# Patient Record
Sex: Female | Born: 1965 | Race: White | Hispanic: No | Smoking: Never smoker
Health system: Southern US, Community
[De-identification: ages and names within clinical notes are randomized; demographics above are authoritative.]

## PROBLEM LIST (undated history)

## (undated) DIAGNOSIS — F32A Depression, unspecified: Secondary | ICD-10-CM

## (undated) DIAGNOSIS — T7840XA Allergy, unspecified, initial encounter: Secondary | ICD-10-CM

## (undated) DIAGNOSIS — F329 Major depressive disorder, single episode, unspecified: Secondary | ICD-10-CM

## (undated) DIAGNOSIS — E079 Disorder of thyroid, unspecified: Secondary | ICD-10-CM

## (undated) DIAGNOSIS — L300 Nummular dermatitis: Secondary | ICD-10-CM

## (undated) HISTORY — DX: Depression, unspecified: F32.A

## (undated) HISTORY — DX: Allergy, unspecified, initial encounter: T78.40XA

## (undated) HISTORY — DX: Nummular dermatitis: L30.0

## (undated) HISTORY — PX: BREAST SURGERY: SHX581

## (undated) HISTORY — DX: Disorder of thyroid, unspecified: E07.9

## (undated) HISTORY — PX: KNEE ARTHROSCOPY: SUR90

## (undated) HISTORY — DX: Major depressive disorder, single episode, unspecified: F32.9

---

## 1998-08-08 ENCOUNTER — Other Ambulatory Visit: Admission: RE | Admit: 1998-08-08 | Discharge: 1998-08-08 | Payer: Self-pay | Admitting: Obstetrics and Gynecology

## 1999-07-14 ENCOUNTER — Other Ambulatory Visit: Admission: RE | Admit: 1999-07-14 | Discharge: 1999-07-14 | Payer: Self-pay | Admitting: Obstetrics and Gynecology

## 2000-07-18 ENCOUNTER — Other Ambulatory Visit: Admission: RE | Admit: 2000-07-18 | Discharge: 2000-07-18 | Payer: Self-pay | Admitting: Obstetrics and Gynecology

## 2000-08-22 ENCOUNTER — Other Ambulatory Visit: Admission: RE | Admit: 2000-08-22 | Discharge: 2000-08-22 | Payer: Self-pay | Admitting: Obstetrics and Gynecology

## 2001-06-21 ENCOUNTER — Other Ambulatory Visit: Admission: RE | Admit: 2001-06-21 | Discharge: 2001-06-21 | Payer: Self-pay | Admitting: Obstetrics and Gynecology

## 2001-06-27 ENCOUNTER — Encounter: Admission: RE | Admit: 2001-06-27 | Discharge: 2001-06-27 | Payer: Self-pay | Admitting: Obstetrics and Gynecology

## 2001-07-13 ENCOUNTER — Ambulatory Visit (HOSPITAL_BASED_OUTPATIENT_CLINIC_OR_DEPARTMENT_OTHER): Admission: RE | Admit: 2001-07-13 | Discharge: 2001-07-13 | Payer: Self-pay | Admitting: General Surgery

## 2001-07-13 ENCOUNTER — Encounter: Payer: Self-pay | Admitting: General Surgery

## 2001-07-13 ENCOUNTER — Encounter: Admission: RE | Admit: 2001-07-13 | Discharge: 2001-07-13 | Payer: Self-pay | Admitting: General Surgery

## 2001-07-13 ENCOUNTER — Encounter (INDEPENDENT_AMBULATORY_CARE_PROVIDER_SITE_OTHER): Payer: Self-pay | Admitting: *Deleted

## 2002-07-09 ENCOUNTER — Other Ambulatory Visit: Admission: RE | Admit: 2002-07-09 | Discharge: 2002-07-09 | Payer: Self-pay | Admitting: Obstetrics and Gynecology

## 2002-11-21 ENCOUNTER — Other Ambulatory Visit: Admission: RE | Admit: 2002-11-21 | Discharge: 2002-11-21 | Payer: Self-pay | Admitting: Obstetrics and Gynecology

## 2003-09-24 ENCOUNTER — Other Ambulatory Visit: Admission: RE | Admit: 2003-09-24 | Discharge: 2003-09-24 | Payer: Self-pay | Admitting: Obstetrics and Gynecology

## 2004-08-19 ENCOUNTER — Other Ambulatory Visit: Admission: RE | Admit: 2004-08-19 | Discharge: 2004-08-19 | Payer: Self-pay | Admitting: Obstetrics and Gynecology

## 2005-09-13 ENCOUNTER — Other Ambulatory Visit: Admission: RE | Admit: 2005-09-13 | Discharge: 2005-09-13 | Payer: Self-pay | Admitting: Obstetrics and Gynecology

## 2007-08-15 ENCOUNTER — Encounter: Admission: RE | Admit: 2007-08-15 | Discharge: 2007-08-15 | Payer: Self-pay | Admitting: Obstetrics and Gynecology

## 2010-01-17 IMAGING — MG MM DIAGNOSTIC LTD RIGHT
3 series · 3 of 3 positions shown · non-contrast
Comparison: 08-02-07 and 09-13-05.

[REDACTED] RIGHT
CC and MLO view(s) were taken of the right breast.

DIGITAL LIMITED RIGHT DIAGNOSTIC MAMMOGRAM:
CLINICAL DATA: 42-year-old with asymmetric density right breast on recent screening mammogram.

[R MLO (1 of 2)]
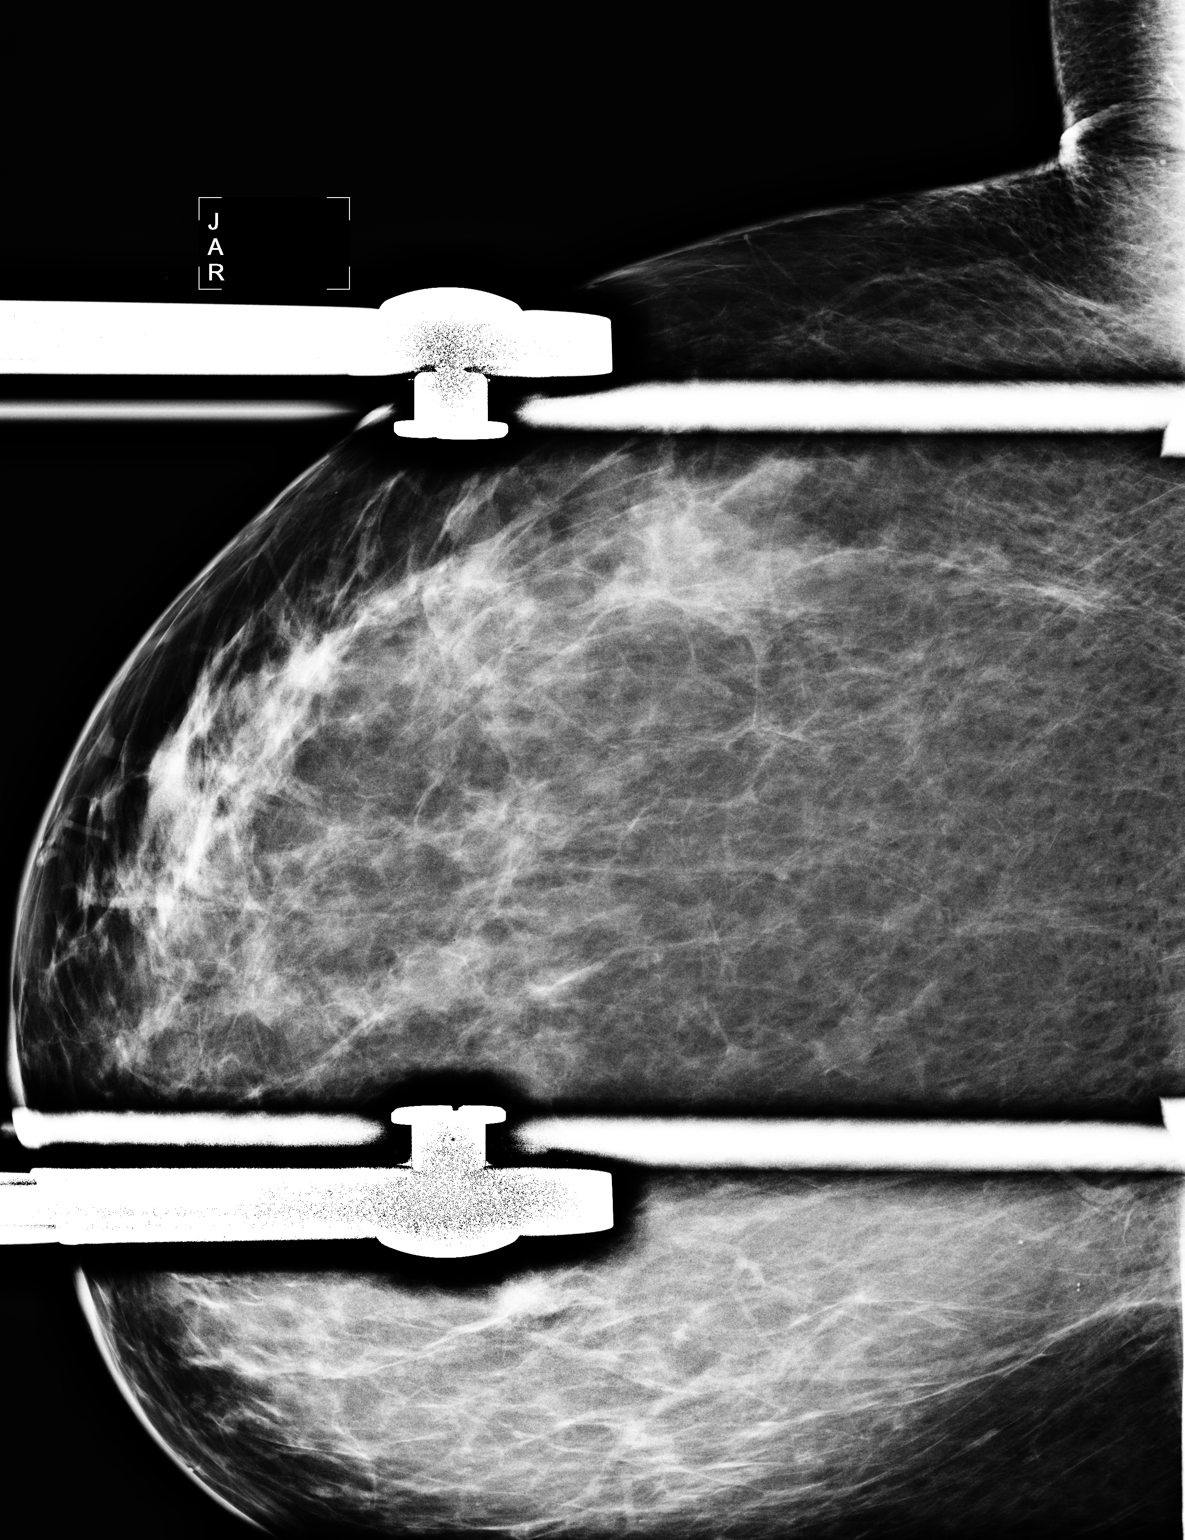

[R CC]
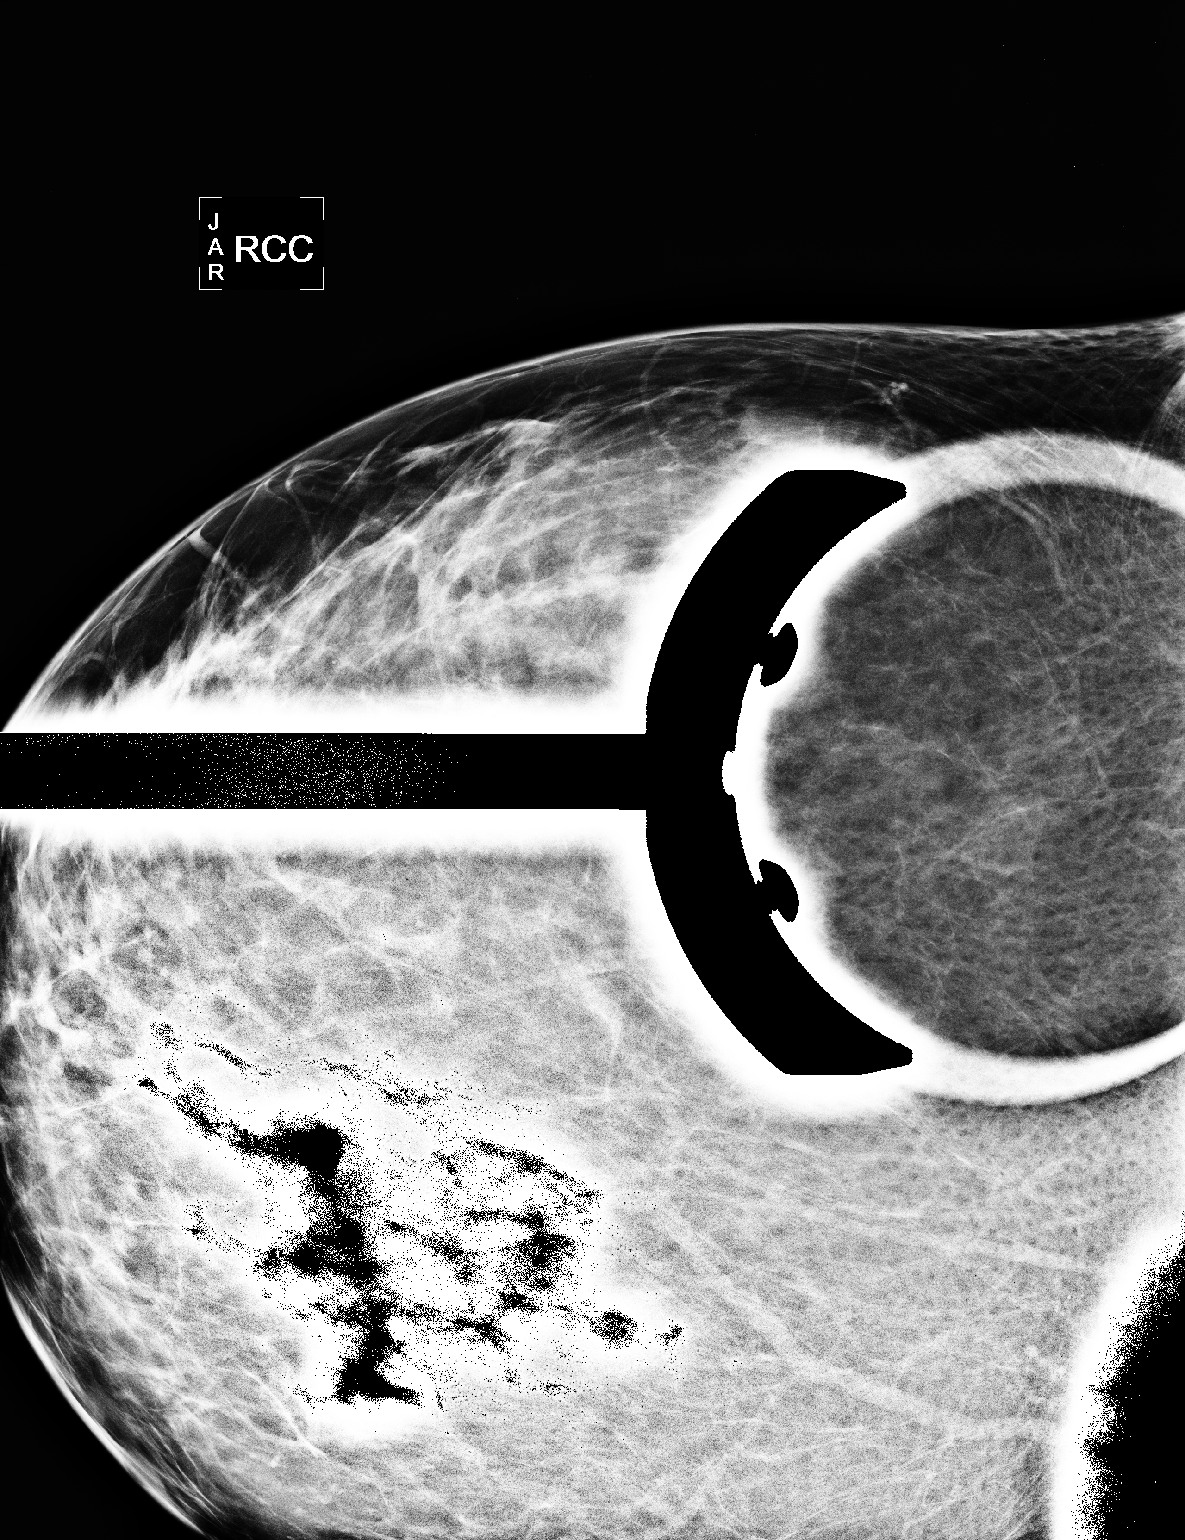

[R MLO (2 of 2)]
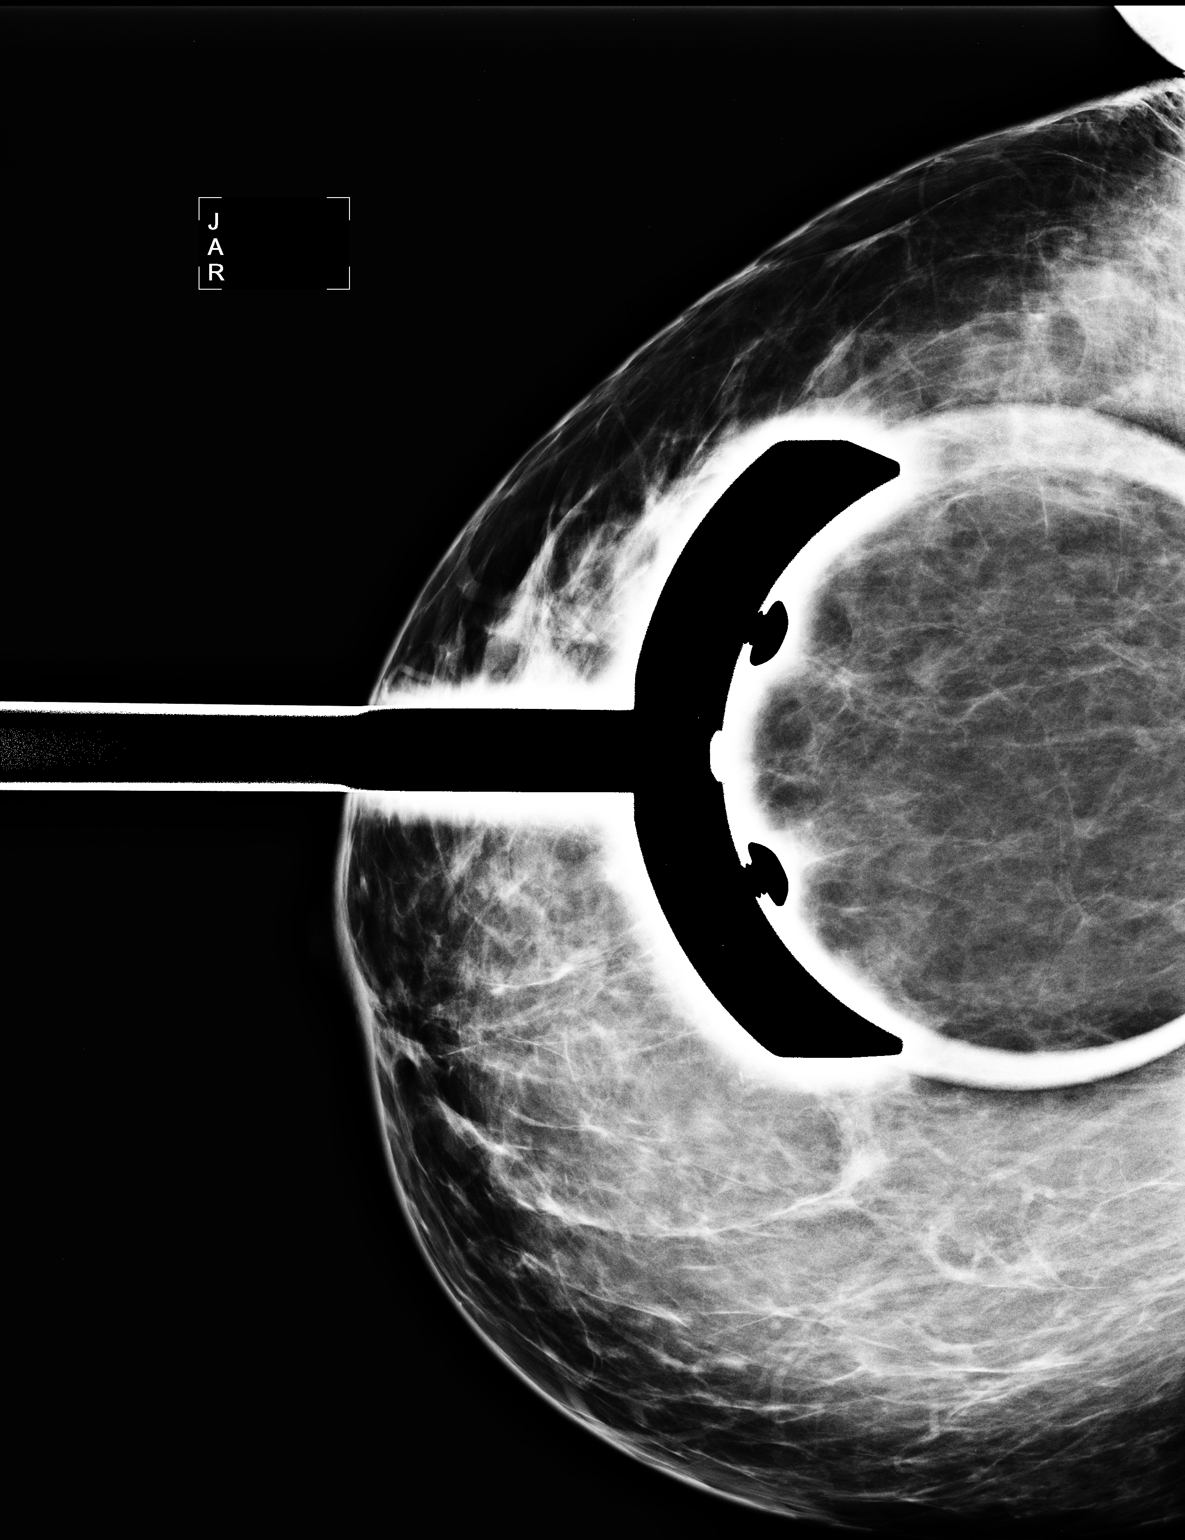

[3 of 3 positions shown; findings below may reference images not displayed]

A spot compression view of the posterior aspect of the outer right breast shows dispersion of 
asymmetric density seen on the screening mammogram.  On the spot compression view, only scattered 
fibroglandular densities are identified, without evidence of mass.  A spot compression view of the 
upper right breast shows heterogeneously dense breast parenchyma, similar in appearance to the 
5330mammogram, and without evidence of mass.
IMPRESSION: No evidence of malignancy in the right breast.  The area in questioned on screening mammogram is 
consistent with overlap of normal fibroglandular breast tissue.  Recommend screening mammogram in 
one year.

ASSESSMENT: Negative - BI-RADS 1

Screening mammogram of both breasts in 11 months.
, THIS PROCEDURE WAS A DIGITAL MAMMOGRAM.

## 2010-06-28 ENCOUNTER — Encounter: Payer: Self-pay | Admitting: Obstetrics and Gynecology

## 2010-10-23 NOTE — Op Note (Signed)
[place]  Patient:    SONI, KEGEL Visit Number: [id number] MRN: [id number]          Service Type: DSU Location: [facility] Attending Physician:  Arlis Porta Dictated by:   Adolph Pollack, M.D. Proc. Date: 07/13/01 Admit Date:  07/13/2001   CC:         Guy Sandifer. Ar[place], M.D.  [facility], [place]   Operative Report  PREOPERATIVE DIAGNOSIS:  Nonpalpable lesion of right breast.  POSTOPERATIVE DIAGNOSIS:  Nonpalpable lesion of right breast.  OPERATION PERFORMED:  Excision of right breast lesion after needle localization.  SURGEON:  Adolph Pollack, M.D.  ASSISTANT:  Cleotis Lema, M.D.  ANESTHESIA:  General.  INDICATIONS FOR PROCEDURE:  The patient is a 45 year old female who has been noted to have a nonpalpable lesion in the right breast.  It has been stable but she does have a risk factor for breast cancer and she requested that it be removed.  I do agree with her request and she was set up for the above procedure.  She underwent a successful needle localization at the breast center and then was brought over to the outpatient surgery center.  DESCRIPTION OF PROCEDURE:  The patient was brought to the operating room and placed supine on the operating table and given a general anesthetic.  The bandage over the wire was removed and the right breast was sterilely prepped and draped.  The mammograms were reviewed preoperatively.  The x on the breast tissue marked where the end of the wire was.  I made a curvilinear incision directly over the x and carried it down through the subcutaneous tissue.  I made flaps in all directions.  I then sharply excised a cylinder of tissue around the wire and sent this for specimen mammography. The specimen mammography demonstrated the lesion to be contained within the sample given.  Next, hemostasis was obtained using electrocautery.  I then closed the wound in two  layers, loosely approximating the subcutaneous tissue with interrupted 3-0 Vicryl sutures and closed the skin with 4-0 Monocryl subcuticular stitch. Steri-Strips and sterile dressings were applied.  The patient tolerated the procedure well without any apparent complications and was taken to the recovery room in satisfactory condition.  She was given postoperative instructions and was given Vicodin for postoperative pain. Dictated by:   Adolph Pollack, M.D. Attending Physician:  Arlis Porta DD:  07/13/01 TD:  07/14/01 Job: [id number]

## 2011-09-01 ENCOUNTER — Other Ambulatory Visit: Payer: Self-pay | Admitting: Obstetrics and Gynecology

## 2013-07-09 ENCOUNTER — Other Ambulatory Visit: Payer: Self-pay | Admitting: Obstetrics and Gynecology

## 2013-11-28 ENCOUNTER — Ambulatory Visit (INDEPENDENT_AMBULATORY_CARE_PROVIDER_SITE_OTHER): Payer: No Typology Code available for payment source | Admitting: Internal Medicine

## 2013-11-28 ENCOUNTER — Encounter: Payer: Self-pay | Admitting: Internal Medicine

## 2013-11-28 VITALS — BP 145/93 | HR 90 | Temp 98.2°F | Ht 70.0 in | Wt 257.0 lb

## 2013-11-28 DIAGNOSIS — Z22322 Carrier or suspected carrier of Methicillin resistant Staphylococcus aureus: Secondary | ICD-10-CM | POA: Insufficient documentation

## 2013-11-28 MED ORDER — CHLORHEXIDINE GLUCONATE 4 % EX LIQD: Freq: Every day | CUTANEOUS | Status: DC

## 2013-11-28 MED ORDER — MUPIROCIN 2 % EX OINT: 1.0000 "application " | TOPICAL_OINTMENT | Freq: Two times a day (BID) | CUTANEOUS | Status: DC

## 2013-11-28 NOTE — Patient Instructions (Signed)
[facility] Five day Treatment Plan for MRSA (Staph) Decolonization  Please let us know if you have questions or concerns or do not understand the information we give you.  Prepare Chose a period when you will be uninterrupted by going away or other distractions. To ensure that your skin is in good condition, follow the Routine Skin Care principles below to reduce drying and enhance healing. Do not start while you have any active boils. Routine Skin Care principles to reduce drying and enhance healing:  Avoid the use of soap when bathing or showering. DO NOT routinely use antiseptic solutions. If you need to use something, chose a soap substitute (examples -QV Wash or Cetaphil).   When drying with a towel, be gentle and pat dry your skin. Avoid rubbing the skin.   Reduce the overall frequency of bathing or showering. A short shower (3 minutes) is better than a bath in terms of its effect on the skin.   Use a simple sorbelene based-cream on your skin prior to showering and immediately after drying. (Examples: Hydroderm or other Sorbelene-based preparations). Especially protect healing or dry areas of skin in this way. Don't use a barrier cream with a vaseline base or with perfumes and additives. You are more likely to be allergic to these products, and cause further damage to your skin.   Make sure that you clean and cover any skin cuts or grazes that occur. Try to avoid picking or biting fingernails and the skin around the nails Keep your fingernails clipped short and clean to reduce problems caused by scratching.   For itchy skin, try gently massaging sorbelene-based cream into itchy areas instead of scratching it. A long-acting non-sedating antihistamine drug is the next option to try. However make sure that you are not taking medication that will interact with this drug type.                                                                         To prepare yourself  for your treatment, it is recommended that you complete the following steps:  Remove nose, ear and other body piercing items for several days prior to the treatment and keep them out during the treatment period   Purchase a new toothbrush, disposable razor (if used), sterident for dentures (if required) and a container of alcohol hand hygiene solution (gel or rub)   Discard old toothbrushes and razors when the treatment starts. Also discard opened deodorant rollers, skin adhesive tapes, skin creams and solutions- all of these may already be contaminated with staph   Discard pumice stone(s), sponges and disposable face cloths if used    Discard all make-up brushes, creams, and implements   Discard or hot wash all fluffy toys   Wash hair brush and comb, nail files, plastic toys and cutters in the dishwasher or purchase new ones   Remove nail varnish and artificial nails  Daily routine for 5 days     *Minimize contact with members of the community during the 5 days of treatment* Body washes The effectiveness of the program increases if the correct procedure is used:  Apply the provided antiseptic body wash (2% chlorhexidine) in the shower daily   Take   care to wash hair, under the arms and into the groin and into any folds of skin   Allow the antiseptic to remain on the skin for at least 5 minutes   Also try 1 capful of bleach in 1/2 bathtub of water for 15 minutes weekly x 3 weeks, blot dry  Nasal ointment  Disinfect your hands with alcohol gel/rub and allow to dry    Open the mupirocin 2% (Bactroban) nasal ointment.   Place small amount (size of match head) of ointment onto a clean cotton swab and massage gently around the inside of the nostril on one side, making sure not to insert it too deeply (no more than 2 cm or a little less than an inch).   Use a new cotton bud for the other nostril so that you do not contaminate the Bactroban tube with staph.    After applying the  ointment, press a finger against the nose next to the nostril opening and use a circular motion to spread the ointment within the nose   Apply the mupirocin ointment two times a day for 5 days   Disinfect your hands with alcohol rub/gel after applying the ointmentp>  Personal items (combs, razor, eyeglasses, jewelry, etc.)     Disinfect all personal items daily with an alcohol-based cleanser  House Environment and Clothes/Linens  On day 2 and 5 of the treatment, clean your house, (especially the bedroom and bathroom). Clean dust off all surfaces and then vacuum clean all floor surfaces AND soft furnishings (such as your favorite chair). If your chair/couch has a vinyl or leather covering then wipe over the chair with warm soapy water and then dry with a clean towel (which should then be washed). Staph lives in skin scales from humans that contaminate the environment. This can lead to re-infection.    Disinfect the shower floor and/or bath tub daily    On days 1, 3, AND 5 of the treatment wash your clothes, underwear, pajamas and bed linen (such as towels, sheets, washcloths, and bath mats). A hot wash with laundry detergent is best (there is no need to use expensive laundry detergent or powder). Dry clothes in sun if possible. Change into clean clothes or pajamas on those days after your shower.    Do not share or exchange any personal items of clothing  Sports/Gym     Surfaces, equipment and towels, and skin-to-skin contact are all potential sources for staph re-infection Pets Dogs and other companion animals can also be colonized with the same strains of MRSA. Best to wash or replace bedding material for the animal and wash the animal at least once during the treatment period with antiseptic solution (2% chlorhexidine wash). Ensure that the skin of the animal is kept in good condition. If the pet has any chronic skin disorders, consult your vet prior to starting your treatment process. What  about my partner, family, or household members? Usually when an aggressive strain of staph moves in to a family or household, only certain members of that group get infections (boils). This is despite the fact that the strain has probably transferred itself from person to person within the group. Those without boils may also be carrying the bacterium, however they must have better resistance (immunity) or perhaps have better skin condition. Staph likes to invade through cuts, scratches and skin with dermatitis or dryness.    Follow-up after decolonization treatment  Possible approaches will vary depending on how your treatment goes. Your Raechal Raben  will instruct you on the follow-up best suited for you. Some options are:   Wait and see - if no further boils occur within 6 months then it is probable that the strain of staph has been eliminated from you.    Continue intermittent body washes 1-2 times per week with 2% aqueous chlorhexidine soap preparation or similar   Future antibiotic use  The best preventative approach to avoiding future problems may be to avoid use of antibiotics unless there is a strong indication. Antibiotics are often prescribed for minor infections or for respiratory infections that are mostly due to viruses. It is in your best interest to ride these infections out rather than taking antibiotics. Taking antibiotics alters your natural bacterial flora on your skin and in your gut. This may reduce your resistance against acquiring a resistant bacterial strain such as MRSA).   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   References Wiese-Posselt et al. Clin Inf Dis 1610:96:[id number]  CDC:   [employer].sehttp://www.cdc.gov/ncidod/dhqp/ar_mrsa_[place].html

## 2013-11-28 NOTE — Assessment & Plan Note (Signed)
I gave instructions for repeating decolonization with more emphasis on 45 minutes spent with review of record with 50% spent in counseling of pt.

## 2013-11-28 NOTE — Progress Notes (Signed)
   Subjective:    Patient ID: Lori French, female    DOB: 06/18/1965, 48 y.o.   MRN: [id number]  HPI New patient evaluation.  Here due to recurrent episodes of cellulitis.  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  Has occurred about 6-7 times over the last year.  Most recently upper lip.  Now all healed.  Is frustrated with reccurence.  Hoping for a better solution.  Has tried bactroban in the nose, hibiclens, some environmental measures.  Had a fever last time.  Typically is lanced.     Review of Systems  Constitutional: Negative for fever and fatigue.  Gastrointestinal: Negative for diarrhea.  Hematological: Negative for adenopathy.       Objective:   Physical Exam  Constitutional: She appears well-developed and well-nourished.  Eyes: No scleral icterus.  Skin: No rash noted.          Assessment & Plan:

## 2014-03-12 ENCOUNTER — Other Ambulatory Visit: Payer: Self-pay | Admitting: Obstetrics and Gynecology

## 2014-03-13 LAB — CYTOLOGY - PAP

## 2014-07-05 ENCOUNTER — Other Ambulatory Visit: Payer: Self-pay | Admitting: Obstetrics and Gynecology

## 2014-07-08 LAB — CYTOLOGY - PAP

## 2015-04-24 ENCOUNTER — Ambulatory Visit: Payer: Self-pay | Admitting: Sports Medicine

## 2017-05-10 ENCOUNTER — Encounter: Payer: Self-pay | Admitting: Podiatry

## 2017-05-10 ENCOUNTER — Ambulatory Visit (INDEPENDENT_AMBULATORY_CARE_PROVIDER_SITE_OTHER): Payer: BLUE CROSS/BLUE SHIELD

## 2017-05-10 ENCOUNTER — Ambulatory Visit (INDEPENDENT_AMBULATORY_CARE_PROVIDER_SITE_OTHER): Payer: BLUE CROSS/BLUE SHIELD | Admitting: Podiatry

## 2017-05-10 VITALS — BP 102/50 | HR 79 | Resp 16

## 2017-05-10 DIAGNOSIS — M779 Enthesopathy, unspecified: Principal | ICD-10-CM

## 2017-05-10 DIAGNOSIS — M7750 Other enthesopathy of unspecified foot: Secondary | ICD-10-CM

## 2017-05-10 DIAGNOSIS — M778 Other enthesopathies, not elsewhere classified: Secondary | ICD-10-CM

## 2017-05-10 MED ORDER — METHYLPREDNISOLONE 4 MG PO TBPK: ORAL_TABLET | ORAL | 0 refills | Status: DC

## 2017-05-10 MED ORDER — MELOXICAM 15 MG PO TABS: 15.0000 mg | ORAL_TABLET | Freq: Every day | ORAL | 3 refills | Status: DC

## 2017-05-10 NOTE — Progress Notes (Signed)
  Subjective:  Patient ID: Lori French, female    DOB: 04/30/1966,  MRN: [id number] HPI Chief Complaint  Patient presents with  . Foot Pain    Arch, dorsal midfoot and medial foot bilateral (L>R) - aching x years, worse recently since been on feet a lot more with current job, seen Dr Lori French in past  for "high arches and PF" and made orthotics-lost them    51 y.o. female presents with the above complaint.     Past Medical History:  Diagnosis Date  . Allergy   . Depression   . Nummular eczema   . Thyroid disease    Past Surgical History:  Procedure Laterality Date  . BREAST SURGERY    . KNEE ARTHROSCOPY      Current Outpatient Medications:  .  levothyroxine (SYNTHROID, LEVOTHROID) 75 MCG tablet, Take 75 mcg by mouth daily before breakfast., Disp: , Rfl:  .  venlafaxine XR (EFFEXOR-XR) 150 MG 24 hr capsule, Take 150 mg by mouth daily with breakfast., Disp: , Rfl:   No Known Allergies Review of Systems  All other systems reviewed and are negative.  Objective:   Vitals:   05/10/17 1121  BP: (!) 102/50  Pulse: 79  Resp: 16    General: Well developed, nourished, in no acute distress, alert and oriented x3   Dermatological: Skin is warm, dry and supple bilateral. Nails x 10 are well maintained; remaining integument appears unremarkable at this time. There are no open sores, no preulcerative lesions, no rash or signs of infection present.  Vascular: Dorsalis Pedis artery and Posterior Tibial artery pedal pulses are 2/4 bilateral with immedate capillary fill time. Pedal hair growth present. No varicosities and no lower extremity edema present bilateral.   Neruologic: Grossly intact via light touch bilateral. Vibratory intact via tuning fork bilateral. Protective threshold with Semmes Wienstein monofilament intact to all pedal sites bilateral. Patellar and Achilles deep tendon reflexes 2+ bilateral. No Babinski or clonus noted bilateral.   Musculoskeletal: No gross boney pedal  deformities bilateral. No pain, crepitus, or limitation noted with foot and ankle range of motion bilateral. Muscular strength 5/5 in all groups tested bilateral.  Gait: Unassisted, Nonantalgic.    Radiographs:  3 views of the bilateral foot taken today in the office demonstrates an osseous immature individual with a cavus foot deformity.  Significant spurring to the dorsal aspect of the right foot at the base of the second metatarsal cuneiform joint.  There is tarsometatarsal joint does demonstrate dorsal spurring joint space narrowing and subchondral sclerosis.  The contralateral foot demonstrates similar findings.  Minimal plantar fasciitis noted on radiographs.  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  Plan: Patient has had orthotics in the past and we are going to have her make an appointment with Lori French for a new set of orthotics.  Also injected 20 mg to the dorsal aspect of the bilateral foot with 5 mg of Kenalog overlying the tarsometatarsal joints.  Also injected an additional 10 mg with 5 mg of Marcaine to the medial aspect of the left foot near the insertion of the tibialis anterior tendon.  Starting on a Medrol Dosepak to be followed by meloxicam.  We discussed appropriate shoe gear stretching exercises ice therapy and shoe gear modifications.  I will follow-up with her in the near future for orthotics with Lori French.     Lori French, [place]

## 2017-05-19 ENCOUNTER — Ambulatory Visit (INDEPENDENT_AMBULATORY_CARE_PROVIDER_SITE_OTHER): Payer: BLUE CROSS/BLUE SHIELD | Admitting: Orthotics

## 2017-05-19 DIAGNOSIS — M7750 Other enthesopathy of unspecified foot: Secondary | ICD-10-CM | POA: Diagnosis not present

## 2017-05-19 DIAGNOSIS — M778 Other enthesopathies, not elsewhere classified: Secondary | ICD-10-CM

## 2017-05-19 DIAGNOSIS — M779 Enthesopathy, unspecified: Principal | ICD-10-CM

## 2017-05-19 NOTE — Progress Notes (Signed)
Patient presents today for casting CMFO to address PF pain associated secondary to pes cavus foot type.   RIchy to fab device with hug arch, deep heel cup.spenco cover over 1/16" PPT.

## 2017-06-09 ENCOUNTER — Encounter: Payer: Self-pay | Admitting: Podiatry

## 2017-06-09 ENCOUNTER — Ambulatory Visit (INDEPENDENT_AMBULATORY_CARE_PROVIDER_SITE_OTHER): Payer: 59 | Admitting: Podiatry

## 2017-06-09 DIAGNOSIS — M7750 Other enthesopathy of unspecified foot: Secondary | ICD-10-CM

## 2017-06-09 DIAGNOSIS — M779 Enthesopathy, unspecified: Principal | ICD-10-CM

## 2017-06-09 DIAGNOSIS — M778 Other enthesopathies, not elsewhere classified: Secondary | ICD-10-CM

## 2017-06-11 NOTE — Progress Notes (Signed)
She presents today for follow-up of capsulitis and osteoarthritic changes to the dorsal arch bilaterally.  She states that they seem to be doing better and is getting some pins and needles type sensation.  States that I had orthotics made but they told me that they were not in yet.  Objective: Vital signs are stable alert and oriented x3.  Pulses are palpable.  She still has pain on palpation with allodynic type symptoms because of dorsal aspect of the bilateral foot.  Assessment: Osteoarthritis capsulitis.  Plan: I injected the dorsal aspect of the bilateral foot today after verbal consent was given.  This was injected with 20 mg of Kenalog to each foot with 5 mg of Marcaine mixed.  She will follow-up with me once orthotics command.

## 2017-06-16 ENCOUNTER — Encounter: Payer: BLUE CROSS/BLUE SHIELD | Admitting: Orthotics

## 2017-06-20 ENCOUNTER — Ambulatory Visit (INDEPENDENT_AMBULATORY_CARE_PROVIDER_SITE_OTHER): Payer: 59 | Admitting: Orthotics

## 2017-06-20 DIAGNOSIS — M779 Enthesopathy, unspecified: Principal | ICD-10-CM

## 2017-06-20 DIAGNOSIS — M778 Other enthesopathies, not elsewhere classified: Secondary | ICD-10-CM

## 2017-06-20 NOTE — Progress Notes (Signed)
Patient came in today to pick up custom made foot orthotics.  The goals were accomplished and the patient reported no dissatisfaction with said orthotics.  Patient was advised of breakin period and how to report any issues. 

## 2017-06-29 ENCOUNTER — Encounter: Payer: Self-pay | Admitting: Podiatry

## 2017-07-14 ENCOUNTER — Ambulatory Visit (INDEPENDENT_AMBULATORY_CARE_PROVIDER_SITE_OTHER): Payer: 59 | Admitting: Podiatry

## 2017-07-14 ENCOUNTER — Encounter: Payer: Self-pay | Admitting: Podiatry

## 2017-07-14 DIAGNOSIS — M7750 Other enthesopathy of unspecified foot: Secondary | ICD-10-CM | POA: Diagnosis not present

## 2017-07-14 DIAGNOSIS — M779 Enthesopathy, unspecified: Principal | ICD-10-CM

## 2017-07-14 DIAGNOSIS — M778 Other enthesopathies, not elsewhere classified: Secondary | ICD-10-CM

## 2017-07-17 NOTE — Progress Notes (Signed)
She presents today for follow-up of capsulitis to the dorsal aspect of the bilateral foot.  She states that is so much better than it was.  Objective: Vital signs are stable she is alert and oriented x3 no reproducible pain.  Pulses remain palpable.  Assessment: Osteoarthritis capsulitis resolving dorsal aspect left foot.  Plan: Follow-up with me on as-needed basis continue appropriate shoe gear.

## 2017-12-21 ENCOUNTER — Other Ambulatory Visit: Payer: Self-pay | Admitting: Podiatry

## 2022-02-17 DIAGNOSIS — M25561 Pain in right knee: Secondary | ICD-10-CM | POA: Diagnosis not present

## 2022-02-17 DIAGNOSIS — G8929 Other chronic pain: Secondary | ICD-10-CM | POA: Diagnosis not present

## 2022-03-09 DIAGNOSIS — Z13220 Encounter for screening for lipoid disorders: Secondary | ICD-10-CM | POA: Diagnosis not present

## 2022-03-09 DIAGNOSIS — Z1331 Encounter for screening for depression: Secondary | ICD-10-CM | POA: Diagnosis not present

## 2022-03-09 DIAGNOSIS — Z0000 Encounter for general adult medical examination without abnormal findings: Secondary | ICD-10-CM | POA: Diagnosis not present

## 2022-03-09 DIAGNOSIS — Z6841 Body Mass Index (BMI) 40.0 and over, adult: Secondary | ICD-10-CM | POA: Diagnosis not present

## 2022-03-09 DIAGNOSIS — Z23 Encounter for immunization: Secondary | ICD-10-CM | POA: Diagnosis not present

## 2022-03-10 DIAGNOSIS — E039 Hypothyroidism, unspecified: Secondary | ICD-10-CM | POA: Diagnosis not present

## 2022-03-11 DIAGNOSIS — Z131 Encounter for screening for diabetes mellitus: Secondary | ICD-10-CM | POA: Diagnosis not present

## 2022-06-01 DIAGNOSIS — H66001 Acute suppurative otitis media without spontaneous rupture of ear drum, right ear: Secondary | ICD-10-CM | POA: Diagnosis not present

## 2022-06-01 DIAGNOSIS — J00 Acute nasopharyngitis [common cold]: Secondary | ICD-10-CM | POA: Diagnosis not present

## 2022-08-10 DIAGNOSIS — Z01419 Encounter for gynecological examination (general) (routine) without abnormal findings: Secondary | ICD-10-CM | POA: Diagnosis not present

## 2022-08-10 DIAGNOSIS — Z130 Encounter for screening for diseases of the blood and blood-forming organs and certain disorders involving the immune mechanism: Secondary | ICD-10-CM | POA: Diagnosis not present

## 2022-08-10 DIAGNOSIS — Z13220 Encounter for screening for lipoid disorders: Secondary | ICD-10-CM | POA: Diagnosis not present

## 2022-08-10 DIAGNOSIS — Z1231 Encounter for screening mammogram for malignant neoplasm of breast: Secondary | ICD-10-CM | POA: Diagnosis not present

## 2022-08-10 DIAGNOSIS — Z6841 Body Mass Index (BMI) 40.0 and over, adult: Secondary | ICD-10-CM | POA: Diagnosis not present

## 2022-08-10 DIAGNOSIS — Z131 Encounter for screening for diabetes mellitus: Secondary | ICD-10-CM | POA: Diagnosis not present

## 2022-08-10 DIAGNOSIS — Z1321 Encounter for screening for nutritional disorder: Secondary | ICD-10-CM | POA: Diagnosis not present

## 2022-08-10 DIAGNOSIS — Z13228 Encounter for screening for other metabolic disorders: Secondary | ICD-10-CM | POA: Diagnosis not present

## 2022-09-02 DIAGNOSIS — R509 Fever, unspecified: Secondary | ICD-10-CM | POA: Diagnosis not present

## 2022-09-02 DIAGNOSIS — J180 Bronchopneumonia, unspecified organism: Secondary | ICD-10-CM | POA: Diagnosis not present

## 2022-10-21 DIAGNOSIS — E039 Hypothyroidism, unspecified: Secondary | ICD-10-CM | POA: Diagnosis not present

## 2023-01-17 DIAGNOSIS — D485 Neoplasm of uncertain behavior of skin: Secondary | ICD-10-CM | POA: Diagnosis not present

## 2023-01-17 DIAGNOSIS — L281 Prurigo nodularis: Secondary | ICD-10-CM | POA: Diagnosis not present

## 2023-02-24 DIAGNOSIS — J329 Chronic sinusitis, unspecified: Secondary | ICD-10-CM | POA: Diagnosis not present

## 2023-08-11 DIAGNOSIS — Z01419 Encounter for gynecological examination (general) (routine) without abnormal findings: Secondary | ICD-10-CM | POA: Diagnosis not present

## 2023-08-11 DIAGNOSIS — Z13228 Encounter for screening for other metabolic disorders: Secondary | ICD-10-CM | POA: Diagnosis not present

## 2023-08-11 DIAGNOSIS — Z6841 Body Mass Index (BMI) 40.0 and over, adult: Secondary | ICD-10-CM | POA: Diagnosis not present

## 2023-08-11 DIAGNOSIS — Z130 Encounter for screening for diseases of the blood and blood-forming organs and certain disorders involving the immune mechanism: Secondary | ICD-10-CM | POA: Diagnosis not present

## 2023-08-11 DIAGNOSIS — Z1231 Encounter for screening mammogram for malignant neoplasm of breast: Secondary | ICD-10-CM | POA: Diagnosis not present

## 2023-08-11 DIAGNOSIS — Z131 Encounter for screening for diabetes mellitus: Secondary | ICD-10-CM | POA: Diagnosis not present

## 2023-08-11 DIAGNOSIS — Z1321 Encounter for screening for nutritional disorder: Secondary | ICD-10-CM | POA: Diagnosis not present

## 2023-09-07 DIAGNOSIS — M25561 Pain in right knee: Secondary | ICD-10-CM | POA: Diagnosis not present

## 2023-09-07 DIAGNOSIS — Z6841 Body Mass Index (BMI) 40.0 and over, adult: Secondary | ICD-10-CM | POA: Diagnosis not present

## 2023-09-07 DIAGNOSIS — R509 Fever, unspecified: Secondary | ICD-10-CM | POA: Diagnosis not present

## 2024-02-02 DIAGNOSIS — Z6841 Body Mass Index (BMI) 40.0 and over, adult: Secondary | ICD-10-CM | POA: Diagnosis not present

## 2024-02-02 DIAGNOSIS — M2550 Pain in unspecified joint: Secondary | ICD-10-CM | POA: Diagnosis not present

## 2024-02-02 DIAGNOSIS — E039 Hypothyroidism, unspecified: Secondary | ICD-10-CM | POA: Diagnosis not present

## 2024-04-10 DIAGNOSIS — M2559 Pain in other specified joint: Secondary | ICD-10-CM | POA: Diagnosis not present

## 2024-04-10 DIAGNOSIS — M1991 Primary osteoarthritis, unspecified site: Secondary | ICD-10-CM | POA: Diagnosis not present
